# Patient Record
Sex: Male | Born: 1990 | Race: Black or African American | Hispanic: No | Marital: Single | Smoking: Current every day smoker
Health system: Southern US, Community
[De-identification: ages and names within clinical notes are randomized; demographics above are authoritative.]

---

## 2014-05-18 ENCOUNTER — Emergency Department: Payer: Self-pay | Admitting: Emergency Medicine

## 2018-02-21 ENCOUNTER — Encounter: Payer: Self-pay | Admitting: Emergency Medicine

## 2018-02-21 ENCOUNTER — Emergency Department: Payer: Self-pay

## 2018-02-21 ENCOUNTER — Emergency Department
Admission: EM | Admit: 2018-02-21 | Discharge: 2018-02-21 | Disposition: A | Discharge status: Home / Self Care | Payer: Self-pay | Attending: Emergency Medicine | Admitting: Emergency Medicine

## 2018-02-21 ENCOUNTER — Other Ambulatory Visit: Payer: Self-pay

## 2018-02-21 DIAGNOSIS — Y939 Activity, unspecified: Secondary | ICD-10-CM | POA: Insufficient documentation

## 2018-02-21 DIAGNOSIS — S60111A Contusion of right thumb with damage to nail, initial encounter: Secondary | ICD-10-CM | POA: Insufficient documentation

## 2018-02-21 DIAGNOSIS — S6010XA Contusion of unspecified finger with damage to nail, initial encounter: Secondary | ICD-10-CM

## 2018-02-21 DIAGNOSIS — Y92810 Car as the place of occurrence of the external cause: Secondary | ICD-10-CM | POA: Insufficient documentation

## 2018-02-21 DIAGNOSIS — F17210 Nicotine dependence, cigarettes, uncomplicated: Secondary | ICD-10-CM | POA: Insufficient documentation

## 2018-02-21 DIAGNOSIS — W230XXA Caught, crushed, jammed, or pinched between moving objects, initial encounter: Secondary | ICD-10-CM | POA: Insufficient documentation

## 2018-02-21 DIAGNOSIS — Y999 Unspecified external cause status: Secondary | ICD-10-CM | POA: Insufficient documentation

## 2018-02-21 MED ORDER — NAPROXEN 500 MG PO TABS: 500.0000 mg | ORAL_TABLET | Freq: Two times a day (BID) | ORAL | 0 refills | Status: AC

## 2018-02-21 MED ORDER — TRAMADOL HCL 50 MG PO TABS: 50.0000 mg | ORAL_TABLET | Freq: Four times a day (QID) | ORAL | 0 refills | Status: AC | PRN

## 2018-02-21 NOTE — ED Notes (Signed)
See triage note   Presents with pain and swelling to right thumb  States he shut thumb in car door about 3 days ago

## 2018-02-21 NOTE — ED Provider Notes (Signed)
[facility] Provider Note ____________________________________________  Time seen: Approximately 8:36 PM  I have reviewed the triage vital signs and the nursing notes.   HISTORY  Chief Complaint Finger Injury    HPI [facility] is a 27 y.o. male who presents to the emergency department for evaluation and treatment of right thumb pain. He smashed it in a car door 3 days ago. Pain continues to worsen. Blood under the nail is not going away. No alleviating measures prior to arrival.  History reviewed. No pertinent past medical history.  There are no active problems to display for this patient.   History reviewed. No pertinent surgical history.  Prior to Admission medications   Medication Sig Start Date End Date Taking? Authorizing Provider  naproxen (NAPROSYN) 500 MG tablet Take 1 tablet (500 mg total) by mouth 2 (two) times daily with a meal. 02/21/18   Johncarlos Holtsclaw B, FNP  traMADol (ULTRAM) 50 MG tablet Take 1 tablet (50 mg total) by mouth every 6 (six) hours as needed. 02/21/18   Chinita Pesterriplett, Garyn Arlotta B, FNP    Allergies Patient has no known allergies.  No family history on file.  Social History Social History   Tobacco Use  . Smoking status: Current Every Day Smoker    Types: Cigarettes  . Smokeless tobacco: Never Used  Substance Use Topics  . Alcohol use: Yes  . Drug use: Never    Review of Systems Constitutional: Negative for fever. Cardiovascular: Negative for chest pain. Respiratory: Negative for shortness of breath. Musculoskeletal: Positive for right thumb pain. Skin: Positive for subungual hematoma right thumb.  Neurological: Negative for decrease in sensation  ____________________________________________   PHYSICAL EXAM:  VITAL SIGNS: ED Triage Vitals  Enc Vitals Group     BP 02/21/18 1021 (!) 143/79     Pulse Rate 02/21/18 1021 (!) 51     Resp 02/21/18 1021 16     Temp 02/21/18 1021 97.8 F (36.6  C)     Temp Source 02/21/18 1021 Oral     SpO2 02/21/18 1021 97 %     Weight 02/21/18 1022 200 lb (90.7 kg)     Height 02/21/18 1022 5\' 11"  (1.803 m)     Head Circumference --      Peak Flow --      Pain Score 02/21/18 1022 8     Pain Loc --      Pain Edu? --      Excl. in GC? --     Constitutional: Alert and oriented. Well appearing and in no acute distress. Eyes: Conjunctivae are clear without discharge or drainage Head: Atraumatic Neck: Supple Respiratory: No cough. Respirations are even and unlabored. Musculoskeletal: Full ROM of the right thumb Neurologic: Motor and sensory function is intact.  Skin: Subungual hematoma right thumb.  Psychiatric: Affect and behavior are appropriate.  ____________________________________________   LABS (all labs ordered are listed, but only abnormal results are displayed)  Labs Reviewed - No data to display ____________________________________________  RADIOLOGY  Right thumb x-ray is negative for bony abnormality per radiology. ____________________________________________   PROCEDURES  .Nail Removal Date/Time: 02/21/2018 8:47 PM Performed by: Chinita Pesterriplett, Orenthal Debski B, FNP Authorized by: Chinita Pesterriplett, Jazmin Vensel B, FNP   Consent:    Consent obtained:  Verbal   Consent given by:  Patient   Risks discussed:  Pain Location:    Hand:  R thumb Pre-procedure details:    Skin preparation:  Alcohol Anesthesia (see MAR for exact dosages):  Anesthesia method:  None Trephination:    Subungual hematoma drained: yes     Trephination instrument:  Cautery    ____________________________________________   INITIAL IMPRESSION / ASSESSMENT AND PLAN / ED COURSE  [name] is a 27 y.o. who presents to the emergency department for treatment and evaluation of right thumb injury. X-ray is negative for fracture. Subungual hematoma drained with cautery pen with substantial return of dark serous drainage. Wound care discussed. He is to follow up  with the primary care provider of his choice for symptoms of concern or return to the ER if unable to schedule an appointment.  Medications - No data to display  Pertinent labs & imaging results that were available during my care of the patient were reviewed by me and considered in my medical decision making (see chart for details).  _________________________________________   FINAL CLINICAL IMPRESSION(S) / ED DIAGNOSES  Final diagnoses:  Subungual hematoma of digit of hand, initial encounter    ED Discharge Orders         Ordered    traMADol (ULTRAM) 50 MG tablet  Every 6 hours PRN     02/21/18 1235    naproxen (NAPROSYN) 500 MG tablet  2 times daily with meals     02/21/18 1235           If controlled substance prescribed during this visit, 12 month history viewed on the [facility] prior to issuing an initial prescription for Schedule II or III opiod.    Chinita Pester, FNP 02/21/18 2107    Governor Rooks, MD 02/25/18 1020

## 2018-02-21 NOTE — ED Triage Notes (Signed)
Patient has bruise under right thumb nailbed, states he "slammed" it in car door 3 days ago.  Here because of increased pain.  Bruised nailbed.

## 2019-10-30 IMAGING — DX DG FINGER THUMB 2+V*R*
3 series · 3 of 3 positions shown · non-contrast
Comparison: None.

CLINICAL DATA: Right thumb pain after injury in car door 3 days
ago.

EXAM:
RIGHT THUMB 2+V

[finger ap]
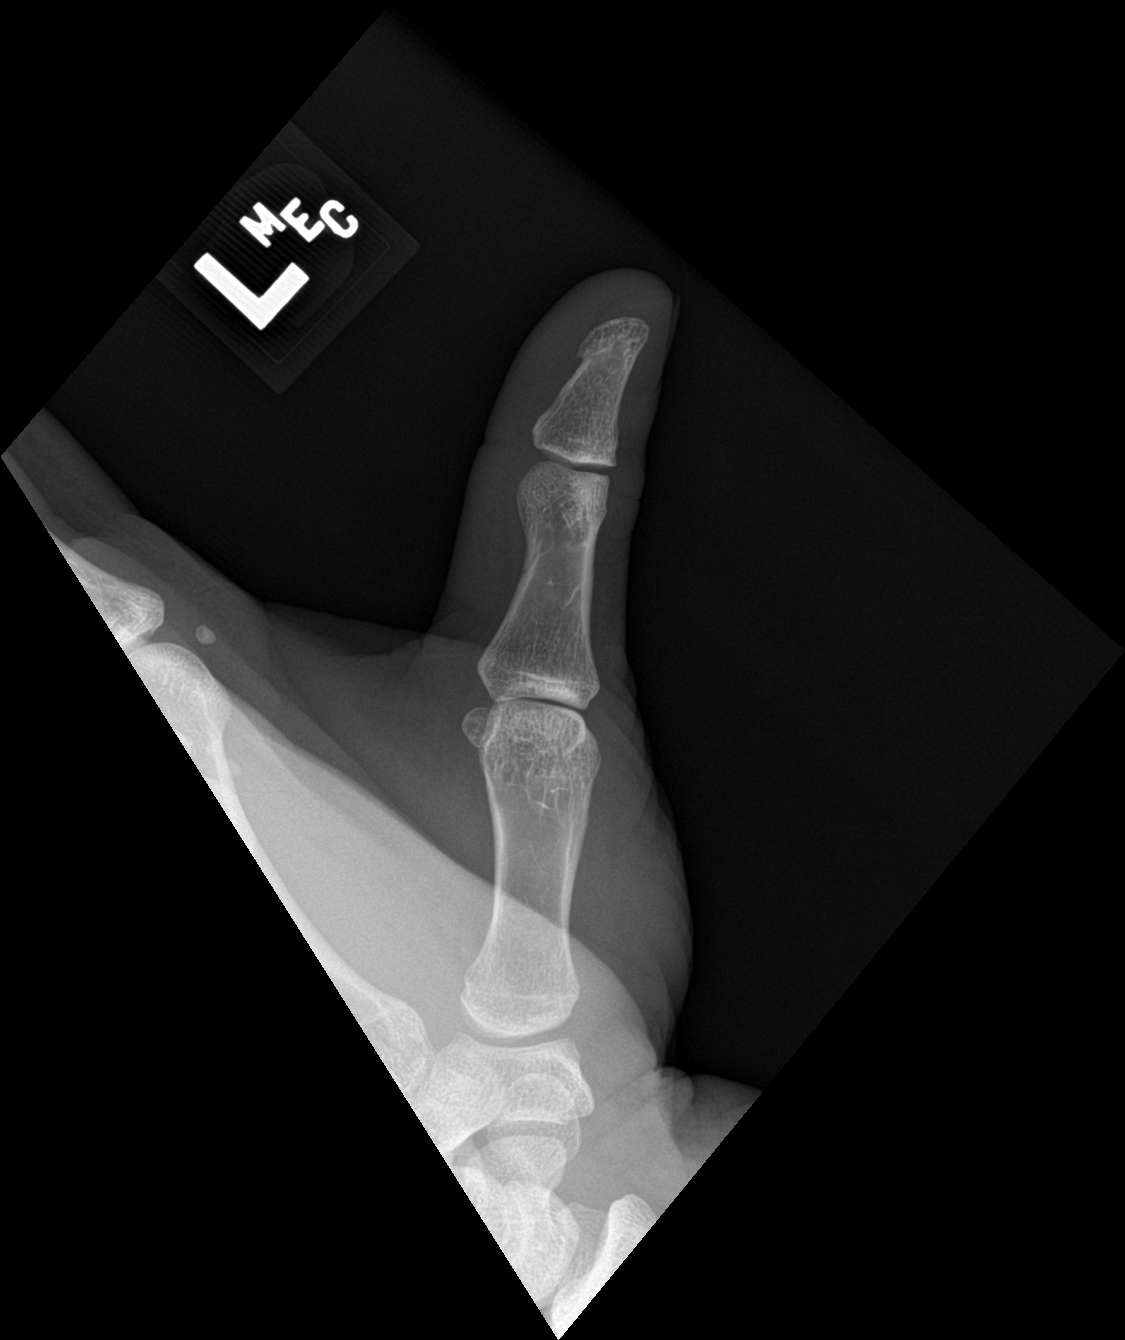

[finger obl]
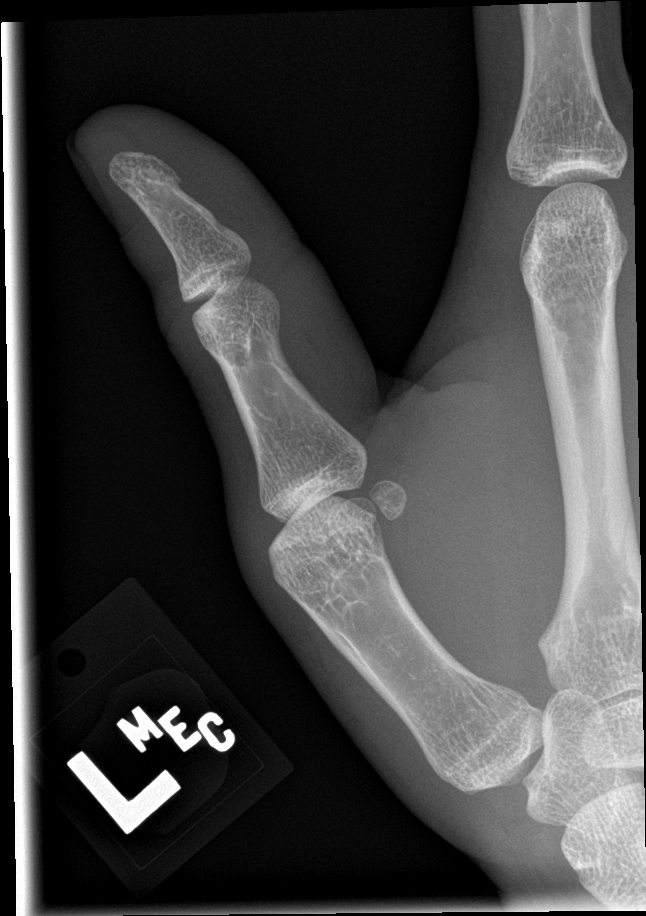

[finger lat]
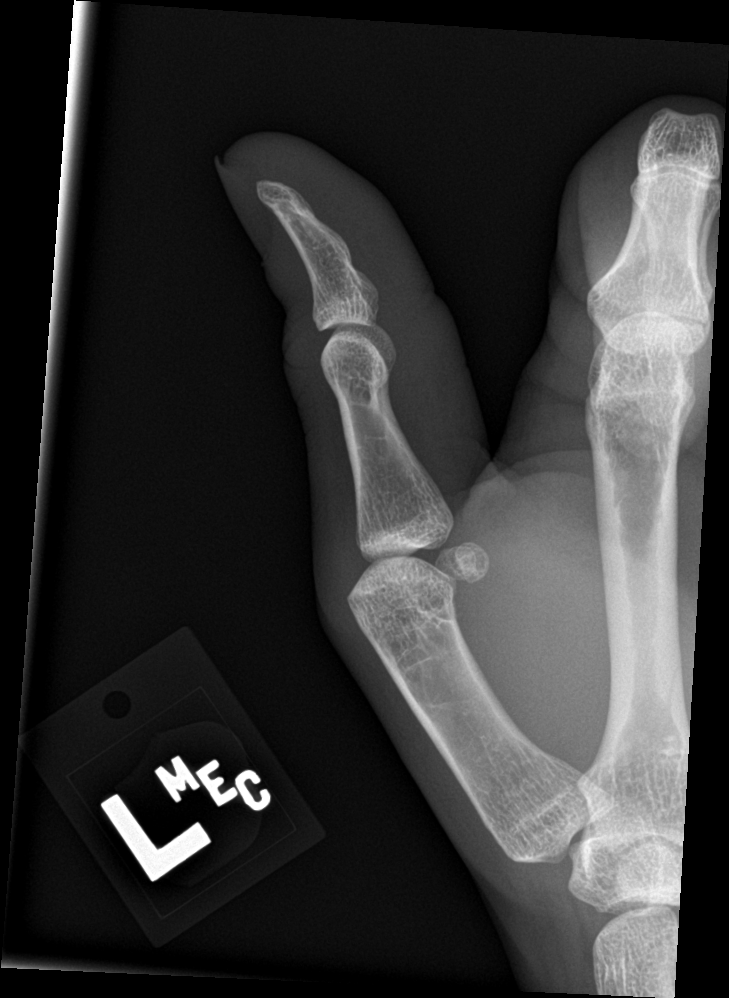

[3 of 3 positions shown; findings below may reference images not displayed]

FINDINGS: There is no evidence of fracture or dislocation. There is no
evidence of arthropathy or other focal bone abnormality. Soft
tissues are unremarkable
IMPRESSION: Normal right thumb.

## 2023-08-04 ENCOUNTER — Ambulatory Visit: Payer: Self-pay

## 2023-08-04 ENCOUNTER — Ambulatory Visit
Admission: RE | Admit: 2023-08-04 | Discharge: 2023-08-04 | Disposition: A | Discharge status: Home / Self Care | Source: Ambulatory Visit | Attending: Physician Assistant

## 2023-08-04 VITALS — BP 133/80 | HR 56 | Temp 98.5°F | Resp 15 | Ht 71.0 in | Wt 190.0 lb

## 2023-08-04 DIAGNOSIS — Z113 Encounter for screening for infections with a predominantly sexual mode of transmission: Secondary | ICD-10-CM | POA: Insufficient documentation

## 2023-08-04 NOTE — ED Provider Notes (Signed)
 [facility] [place] [facility] [place]   Name: [facility] DOB: [birth year] MRN: [id number] CSN: [id number] PCP: Patient, No Pcp Per  Arrival date and time:  08/04/23 1325  Chief Complaint:  Exposure to STD   NOTE: Prior to seeing the patient today, I have reviewed the triage nursing documentation and vital signs. Clinical staff has updated patient's PMH/PSHx, current medication list, and drug allergies/intolerances to ensure comprehensive history available to assist in medical decision making.   History:   HPI: [facility] is a 33 y.o. male who presents today alone requesting STD testing.  Patient denies any symptoms.  He is here to test prior to becoming intimate with a new partner.  He has never had a STI and has never received full STI screening.  History reviewed. No pertinent past medical history.  History reviewed. No pertinent surgical history.  History reviewed. No pertinent family history.  Social History   Tobacco Use   Smoking status: Every Day    Types: Cigarettes   Smokeless tobacco: Never  Vaping Use   Vaping status: Never Used  Substance Use Topics   Alcohol use: Yes   Drug use: Yes    Types: Marijuana    There are no active problems to display for this patient.   Home Medications:    No outpatient medications have been marked as taking for the 08/04/23 encounter [facility] Encounter) with [facility] PROVIDER.    Allergies:   Patient has no known allergies.  Review of Systems (ROS): Review of Systems  All other systems reviewed and are negative.    Vital Signs: Today's Vitals   08/04/23 1354 08/04/23 1357  BP:  133/80  Pulse:  (!) 56  Resp:  15  Temp:  98.5 F (36.9 C)  TempSrc:  Oral  SpO2:  100%  Weight: 190 lb (86.2 kg)   Height: 5\' 11"  (1.803 m)   PainSc: 0-No pain     Physical Exam: Physical Exam Vitals and nursing note reviewed.  Constitutional:      Appearance: Normal appearance.  Cardiovascular:     Rate and  Rhythm: Normal rate and regular rhythm.     Pulses: Normal pulses.     Heart sounds: Normal heart sounds.  Pulmonary:     Effort: Pulmonary effort is normal.     Breath sounds: Normal breath sounds.  Skin:    General: Skin is warm and dry.  Neurological:     General: No focal deficit present.     Mental Status: He is alert and oriented to person, place, and time.  Psychiatric:        Mood and Affect: Mood normal.        Behavior: Behavior normal.      Urgent Care Treatments / Results:   LABS: PLEASE NOTE: all labs that were ordered this encounter are listed, however only abnormal results are displayed. Labs Reviewed  CYTOLOGY, (ORAL, ANAL, URETHRAL) ANCILLARY ONLY    EKG: -None  RADIOLOGY: No results found.  PROCEDURES: Procedures  MEDICATIONS RECEIVED THIS VISIT: Medications - No data to display  PERTINENT CLINICAL COURSE NOTES/UPDATES:   Initial Impression / Assessment and Plan / Urgent Care Course:  Pertinent labs & imaging results that were available during my care of the patient were personally reviewed by me and considered in my medical decision making (see lab/imaging section of note for values and interpretations).  [facility] is a 33 y.o. male who presents to [facility]  today requesting asymptomatic STI testing.  NP and patient reviewed discharge instructions below during visit.   Patient is well appearing overall in clinic today. He does not appear to be in any acute distress. Presenting symptoms (see HPI) and exam as documented above.   I have reviewed the follow up and strict return precautions for any new or worsening symptoms. Patient is aware of symptoms that would be deemed urgent/emergent, and would thus require further evaluation either here or in the emergency department. At the time of discharge, he verbalized understanding and consent with the discharge plan as it was reviewed with him. All questions were fielded by provider and/or  clinic staff prior to patient discharge.    Final Clinical Impressions / Urgent Care Diagnoses:   Final diagnoses:  Screen for STD (sexually transmitted disease)    New Prescriptions:  [place] Controlled Substance Registry consulted? Not Applicable  No orders of the defined types were placed in this encounter.     Discharge Instructions      You were seen for STD screening and are being treated for the same.   -Your test today testing only for gonorrhea, chlamydia and trichomonas. -I strongly suggest you follow-up with your local health department for further STD testing such as HIV, hep C and syphilis. -You will be given further instructions and prescriptions if anything were to return as positive.  Take care, Dr. Sharlet Salina, NP-c      Recommended Follow up Care:  Patient encouraged to follow up with the following provider within the specified time frame, or sooner as dictated by the severity of his symptoms. As always, he was instructed that for any urgent/emergent care needs, he should seek care either here or in the emergency department for more immediate evaluation.   Bailey Mech, DNP, NP-c   Bailey Mech, NP 08/04/23 1438

## 2023-08-04 NOTE — Discharge Instructions (Addendum)
 You were seen for STD screening and are being treated for the same.   -Your test today testing only for gonorrhea, chlamydia and trichomonas. -I strongly suggest you follow-up with your local health department for further STD testing such as HIV, hep C and syphilis. -You will be given further instructions and prescriptions if anything were to return as positive.  Take care, Dr. Sharlet Salina, NP-c

## 2023-08-04 NOTE — ED Triage Notes (Signed)
 Patient states that he is seeing a new partner.  Patient states that he is here for STD testing.  Patient denies any symptoms.

## 2023-08-06 LAB — CYTOLOGY, (ORAL, ANAL, URETHRAL) ANCILLARY ONLY
Chlamydia: NEGATIVE
Comment: NEGATIVE
Comment: NEGATIVE
Comment: NORMAL
Neisseria Gonorrhea: NEGATIVE
Trichomonas: NEGATIVE
